# Patient Record
Sex: Male | Born: 1965 | Race: White | Hispanic: No
Health system: Southern US, Community
[De-identification: ages and names within clinical notes are randomized; demographics above are authoritative.]

---

## 2011-01-22 ENCOUNTER — Ambulatory Visit
Admission: RE | Admit: 2011-01-22 | Discharge: 2011-01-22 | Disposition: A | Discharge status: Home / Self Care | Payer: PRIVATE HEALTH INSURANCE | Source: Ambulatory Visit | Attending: Family Medicine | Admitting: Family Medicine

## 2011-01-22 ENCOUNTER — Other Ambulatory Visit: Payer: Self-pay | Admitting: Family Medicine

## 2011-01-22 DIAGNOSIS — M79669 Pain in unspecified lower leg: Secondary | ICD-10-CM

## 2013-05-02 ENCOUNTER — Ambulatory Visit
Admission: RE | Admit: 2013-05-02 | Discharge: 2013-05-02 | Disposition: A | Discharge status: Home / Self Care | Payer: BC Managed Care – PPO | Source: Ambulatory Visit | Attending: Family Medicine | Admitting: Family Medicine

## 2013-05-02 ENCOUNTER — Other Ambulatory Visit: Payer: Self-pay | Admitting: Family Medicine

## 2013-05-02 DIAGNOSIS — R079 Chest pain, unspecified: Secondary | ICD-10-CM

## 2016-10-19 ENCOUNTER — Other Ambulatory Visit (HOSPITAL_COMMUNITY): Payer: Self-pay | Admitting: Orthopedic Surgery

## 2016-10-19 DIAGNOSIS — M7989 Other specified soft tissue disorders: Principal | ICD-10-CM

## 2016-10-19 DIAGNOSIS — M79604 Pain in right leg: Secondary | ICD-10-CM

## 2016-10-20 ENCOUNTER — Ambulatory Visit (HOSPITAL_COMMUNITY)
Admission: RE | Admit: 2016-10-20 | Discharge: 2016-10-20 | Disposition: A | Discharge status: Home / Self Care | Payer: BLUE CROSS/BLUE SHIELD | Source: Ambulatory Visit | Attending: Orthopedic Surgery | Admitting: Orthopedic Surgery

## 2016-10-20 DIAGNOSIS — I824Z1 Acute embolism and thrombosis of unspecified deep veins of right distal lower extremity: Secondary | ICD-10-CM | POA: Insufficient documentation

## 2016-10-20 DIAGNOSIS — M79604 Pain in right leg: Secondary | ICD-10-CM | POA: Insufficient documentation

## 2016-10-20 DIAGNOSIS — M7989 Other specified soft tissue disorders: Secondary | ICD-10-CM | POA: Insufficient documentation

## 2016-10-20 NOTE — Progress Notes (Addendum)
**  Preliminary report by tech**  Right lower extremity venous duplex complete. There is no evidence of deep vein thrombosis involving the right lower extremity.  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 There is no evidence of a Baker's cyst on the right. [facility].   10/20/16 10:29 AM Olen CordialGreg Errin Chewning RVT

## 2020-01-05 ENCOUNTER — Emergency Department (HOSPITAL_COMMUNITY)
Admission: EM | Admit: 2020-01-05 | Discharge: 2020-01-05 | Disposition: A | Discharge status: Home / Self Care | Payer: Managed Care, Other (non HMO) | Attending: Emergency Medicine | Admitting: Emergency Medicine

## 2020-01-05 ENCOUNTER — Other Ambulatory Visit: Payer: Self-pay

## 2020-01-05 DIAGNOSIS — R6884 Jaw pain: Secondary | ICD-10-CM

## 2020-01-05 DIAGNOSIS — Z7984 Long term (current) use of oral hypoglycemic drugs: Secondary | ICD-10-CM | POA: Diagnosis not present

## 2020-01-05 DIAGNOSIS — H5713 Ocular pain, bilateral: Secondary | ICD-10-CM | POA: Diagnosis present

## 2020-01-05 DIAGNOSIS — Z79899 Other long term (current) drug therapy: Secondary | ICD-10-CM | POA: Diagnosis not present

## 2020-01-05 MED ORDER — KETOROLAC TROMETHAMINE 60 MG/2ML IM SOLN
60.0000 mg | Freq: Once | INTRAMUSCULAR | Status: AC
  Administered 2020-01-05: 60 mg via INTRAMUSCULAR
  Filled 2020-01-05: qty 2

## 2020-01-05 MED ORDER — TETRACAINE HCL 0.5 % OP SOLN
2.0000 [drp] | Freq: Once | OPHTHALMIC | Status: AC
  Administered 2020-01-05: 2 [drp] via OPHTHALMIC
  Filled 2020-01-05: qty 4

## 2020-01-05 NOTE — Discharge Instructions (Signed)
Thank you for letting us take care of you in the ER today  Please make sure to follow-up with your primary care doctor if your symptoms continue.  Return to the ER for any new or worsening symptoms.

## 2020-01-05 NOTE — ED Triage Notes (Signed)
Pt arrived from home complaining of irritation to both eyes and jaw pain. Upon examination, both eyes were noted to be red, swollen, irritated, watery, and sensitive to light. Pt drank solution for colonoscopy prior to the start of his symptoms.

## 2020-01-05 NOTE — ED Provider Notes (Signed)
Medical screening examination/treatment/procedure(s) were conducted as a shared visit with non-physician practitioner(s) and myself.  I personally evaluated the patient during the encounter.    54 year old male presents with bilateral eye burning and photophobia x1 day.  Denies any history of trauma.  Does not wear contact lens.  No fever or headache.  No neck discomfort.  On exam his bilateral injected conjunctive a.  Pupils are equal and reactive.  No active drainage noted.  Suspect possible chemical exposure.  Will irrigate eyes and reassess   Lorre Nick, MD 01/05/20 1034

## 2020-01-05 NOTE — ED Notes (Signed)
Visual acuity complete. R eye 20/20, L eye 20/25

## 2020-01-05 NOTE — ED Notes (Signed)
Pt last took PEG-3350- Sodium Chloride, Sodium Bicarb, and Potassium Chloride oral solution at home around 2230 and again around 0730.

## 2020-01-05 NOTE — ED Provider Notes (Signed)
[facility]-EMERGENCY DEPT Provider Note   CSN: [id number] Arrival date & time: 01/05/20  0946     History Chief Complaint  Patient presents with  . Eye Problem  . Allergic Reaction    Casey Mathis is a 54 y.o. male.  HPI 54 year old male with no significant medical history presents to the ER with complaints of irritation to both eyes, photosensitivity, and jaw pain.  Patient states that he was scheduled to have a colonoscopy today, and drink the colonoscopy sodium chloride, sodium bicarb and potassium chloride oral solution at around 1130 last night and again at 730 this morning.  He states that he woke up around 2 AM with severe burning to the eyes, pain, photosensitivity, and jaw pain.  He states that the jaw pain does not hurt with movement, but he states he feels like he cannot close his jaw all the way.  He denies any throat swelling, drooling, shortness of breath, chest pain, dizziness, syncope, radiation, rash, tongue swelling, lip swelling, eye swelling.  He does not wear any contacts, denies any eye injuries, no recent welding, no foreign liquids or objects that he is aware of.  He denies.  He denies any fevers or headaches.  He states he has some blurry ventilation secondary to pain.    No past medical history on file.  There are no problems to display for this patient.   The histories are not reviewed yet. Please review them in the "History" navigator section and refresh this SmartLink.     No family history on file.  Social History   Tobacco Use  . Smoking status: Not on file  Substance Use Topics  . Alcohol use: Not on file  . Drug use: Not on file    Home Medications Prior to Admission medications   Medication Sig Start Date End Date Taking? Authorizing Provider  acyclovir (ZOVIRAX) 400 MG tablet Take 400 mg by mouth daily. 12/24/19  Yes [provider]  escitalopram (LEXAPRO) 20 MG tablet Take 20 mg by mouth daily. 12/24/19  Yes  [provider]  metFORMIN (GLUCOPHAGE-XR) 500 MG 24 hr tablet Take 1,000 mg by mouth at bedtime. 11/04/19  Yes [provider]  pantoprazole (PROTONIX) 40 MG tablet Take 40 mg by mouth daily. 12/24/19  Yes [provider]  simvastatin (ZOCOR) 20 MG tablet Take 20 mg by mouth at bedtime. 12/24/19  Yes [provider]    Allergies    Sulfa antibiotics  Review of Systems   Review of Systems  Constitutional: Negative for chills and fever.  HENT: Negative for ear pain and sore throat.        Jaw pain   Eyes: Positive for photophobia, pain and visual disturbance.  Respiratory: Negative for cough and shortness of breath.   Cardiovascular: Negative for chest pain and palpitations.  Gastrointestinal: Negative for abdominal pain and vomiting.  Genitourinary: Negative for dysuria and hematuria.  Musculoskeletal: Negative for arthralgias and back pain.  Skin: Negative for color change and rash.  Neurological: Negative for seizures and syncope.  All other systems reviewed and are negative.   Physical Exam Updated Vital Signs BP 127/73   Pulse 70   Temp 97.7 F (36.5 C) (Axillary)   Resp (!) 22   SpO2 97%   Physical Exam Vitals and nursing note reviewed.  Constitutional:      Appearance: He is well-developed.  HENT:     Head: Normocephalic and atraumatic.     Mouth/Throat:  Comments: Full range of motion of jaw, no tongue swelling, uvula midline, tolerating secretions well, no tenderness to palpation to the jaw bilaterally. Eyes:     General: No scleral icterus.       Right eye: No discharge.        Left eye: No discharge.     Extraocular Movements: Extraocular movements intact.     Pupils: Pupils are equal, round, and reactive to light.     Comments: Bilateral injected conjunctive a.  Pupils equal and reactive, no evidence of hyphema, iris normal.  EOMs intact.  Vision grossly intact.  Eyes are watery, patient endorses photosensitivity when  opening his eyes.  Cardiovascular:     Rate and Rhythm: Normal rate and regular rhythm.     Heart sounds: No murmur heard.   Pulmonary:     Effort: Pulmonary effort is normal. No respiratory distress.     Breath sounds: Normal breath sounds.  Abdominal:     Palpations: Abdomen is soft.     Tenderness: There is no abdominal tenderness.  Musculoskeletal:        General: Deformity present. No tenderness. Normal range of motion.     Cervical back: Neck supple.  Skin:    General: Skin is warm and dry.  Neurological:     General: No focal deficit present.     Mental Status: He is alert and oriented to person, place, and time.     ED Results / Procedures / Treatments   Labs (all labs ordered are listed, but only abnormal results are displayed) Labs Reviewed - No data to display  EKG None  Radiology No results found.  Procedures Procedures (including critical care time)  Medications Ordered in ED Medications  tetracaine (PONTOCAINE) 0.5 % ophthalmic solution 2 drop (2 drops Both Eyes Given 01/05/20 1100)  ketorolac (TORADOL) injection 60 mg (60 mg Intramuscular Given 01/05/20 1209)    ED Course  I have reviewed the triage vital signs and the nursing notes.  Pertinent labs & imaging results that were available during my care of the patient were reviewed by me and considered in my medical decision making (see chart for details).    MDM Rules/Calculators/A&P                         3:3 AM: 54 year old male with complaints of high sensitivity, pain and jaw pain which began around 2 AM this morning.  Exam with bilateral injected conjunctive a with evidence of water, pupils are equal and reactive, vision is grossly intact.  No active crusting, drainage noted.  Suspicion for allergic reaction is low, patient does not have a rash, angioedema, swelling of the eyes or throat, itching.  Patient was also seen and evaluated by Dr. Freida Busman, suspect chemical exposure.  Plan for tetracaine,  irrigation of the eyes and reassessment.  Patient's eyes were irrigated with 500 cc of normal saline in each eye.  Patient was also given Toradol for jaw pain.  On reevaluation, patient reports improvement in eye sensitivity and vision. Low suspicion for ACS, dissection, no chest or neck pain. No evidence of anaphylactic reaction.   Visual acuity R eye 20/20 and L eye 20/25. Stable for discharge with followup for PCP. Return precautions discussed. He voices understanding and is agreeable.   Patient was seen and evaluated by Dr. Freida Busman who is agreeable to the above plan and disposition  Final Clinical Impression(s) / ED Diagnoses Final diagnoses:  Eye pain, bilateral  Jaw pain    Rx / DC Orders ED Discharge Orders    None       Leone Brand 01/05/20 1355    Lorre Nick, MD 01/08/20 1349

## 2021-03-12 ENCOUNTER — Other Ambulatory Visit: Payer: Self-pay | Admitting: Family Medicine

## 2021-03-12 DIAGNOSIS — R7401 Elevation of levels of liver transaminase levels: Secondary | ICD-10-CM

## 2021-03-13 ENCOUNTER — Ambulatory Visit
Admission: RE | Admit: 2021-03-13 | Discharge: 2021-03-13 | Disposition: A | Discharge status: Home / Self Care | Payer: Managed Care, Other (non HMO) | Source: Ambulatory Visit | Attending: Family Medicine | Admitting: Family Medicine

## 2021-03-13 ENCOUNTER — Other Ambulatory Visit: Payer: Self-pay

## 2021-03-13 DIAGNOSIS — R7401 Elevation of levels of liver transaminase levels: Secondary | ICD-10-CM

## 2021-04-23 ENCOUNTER — Other Ambulatory Visit: Payer: Self-pay | Admitting: Family Medicine

## 2021-04-23 ENCOUNTER — Ambulatory Visit
Admission: RE | Admit: 2021-04-23 | Discharge: 2021-04-23 | Disposition: A | Discharge status: Home / Self Care | Payer: Managed Care, Other (non HMO) | Source: Ambulatory Visit | Attending: Family Medicine | Admitting: Family Medicine

## 2021-04-23 ENCOUNTER — Other Ambulatory Visit: Payer: Self-pay

## 2021-04-23 DIAGNOSIS — R059 Cough, unspecified: Secondary | ICD-10-CM

## 2022-05-27 ENCOUNTER — Other Ambulatory Visit: Payer: Self-pay | Admitting: Sports Medicine

## 2022-05-27 DIAGNOSIS — M542 Cervicalgia: Secondary | ICD-10-CM

## 2022-06-13 ENCOUNTER — Ambulatory Visit
Admission: RE | Admit: 2022-06-13 | Discharge: 2022-06-13 | Disposition: A | Discharge status: Home / Self Care | Payer: 59 | Source: Ambulatory Visit | Attending: Sports Medicine | Admitting: Sports Medicine

## 2022-06-13 DIAGNOSIS — M542 Cervicalgia: Secondary | ICD-10-CM

## 2022-08-02 IMAGING — CR DG RIBS W/ CHEST 3+V*R*
5 series · 5 of 5 positions shown · non-contrast
Comparison: Chest radiograph dated 05/02/2013.

CLINICAL DATA: Cough and congestion.

EXAM:
RIGHT RIBS AND CHEST - 3+ VIEW

[w chest pa]
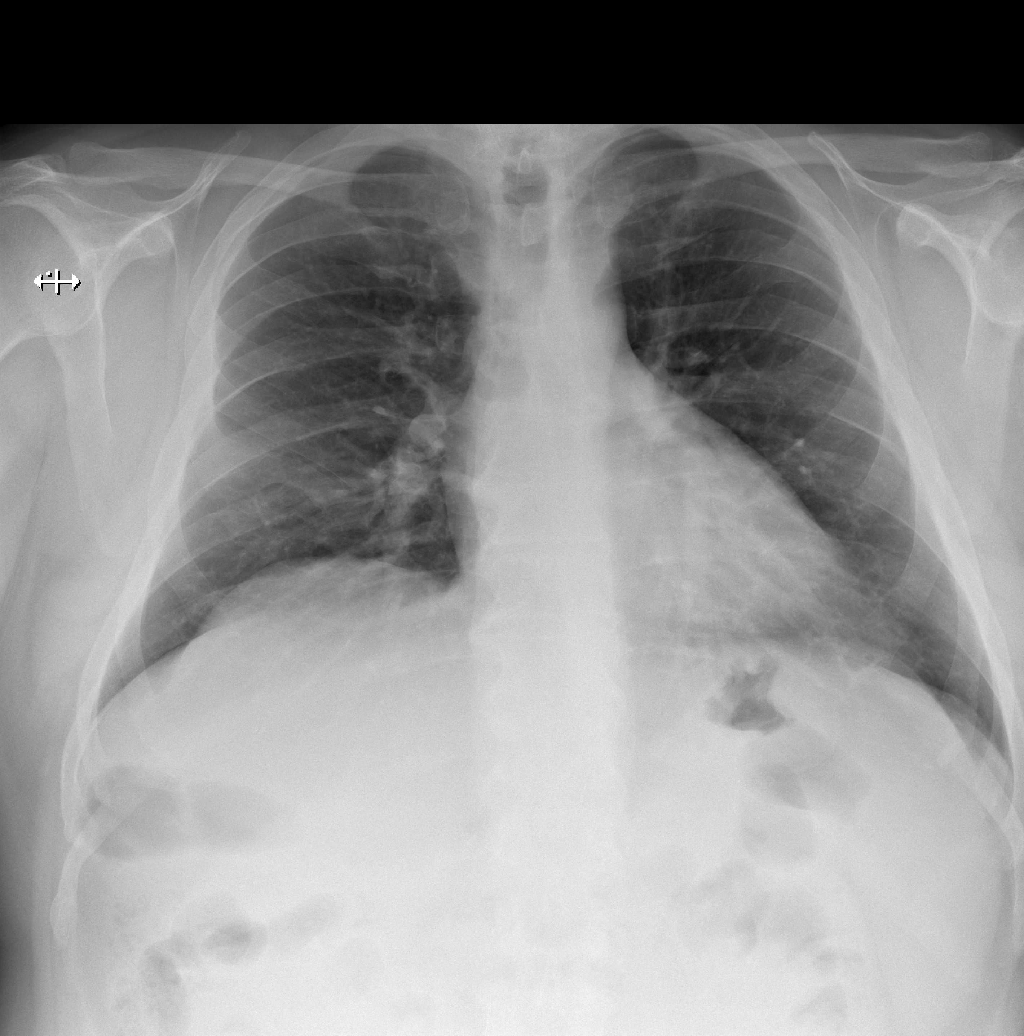

[w ribs ap upper right]
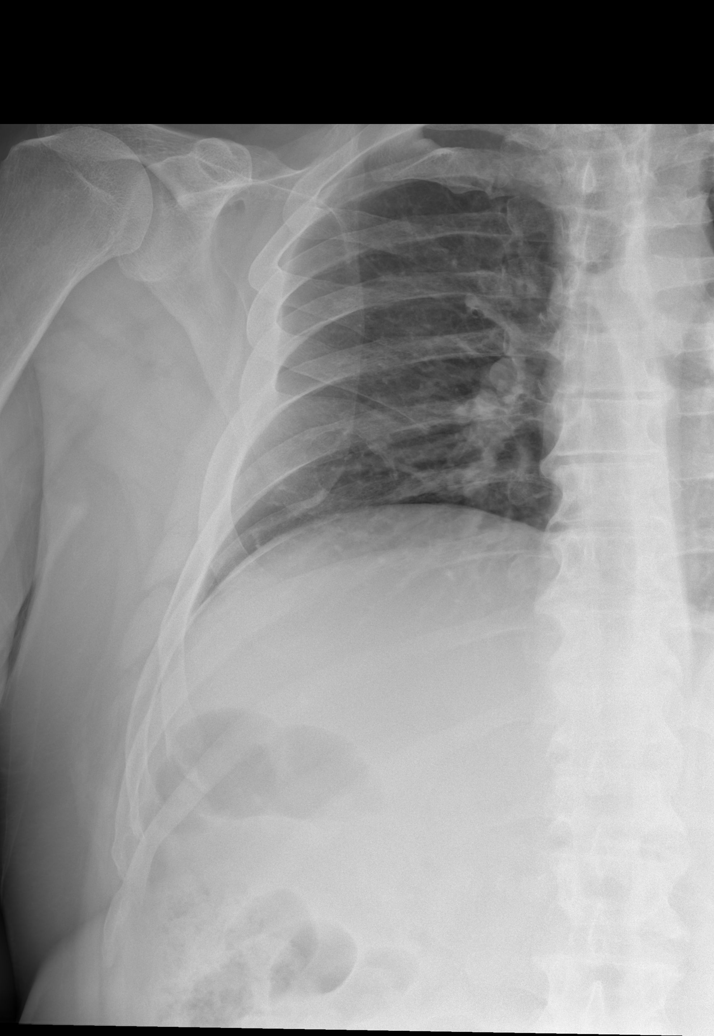

[w ribs ap lower right]
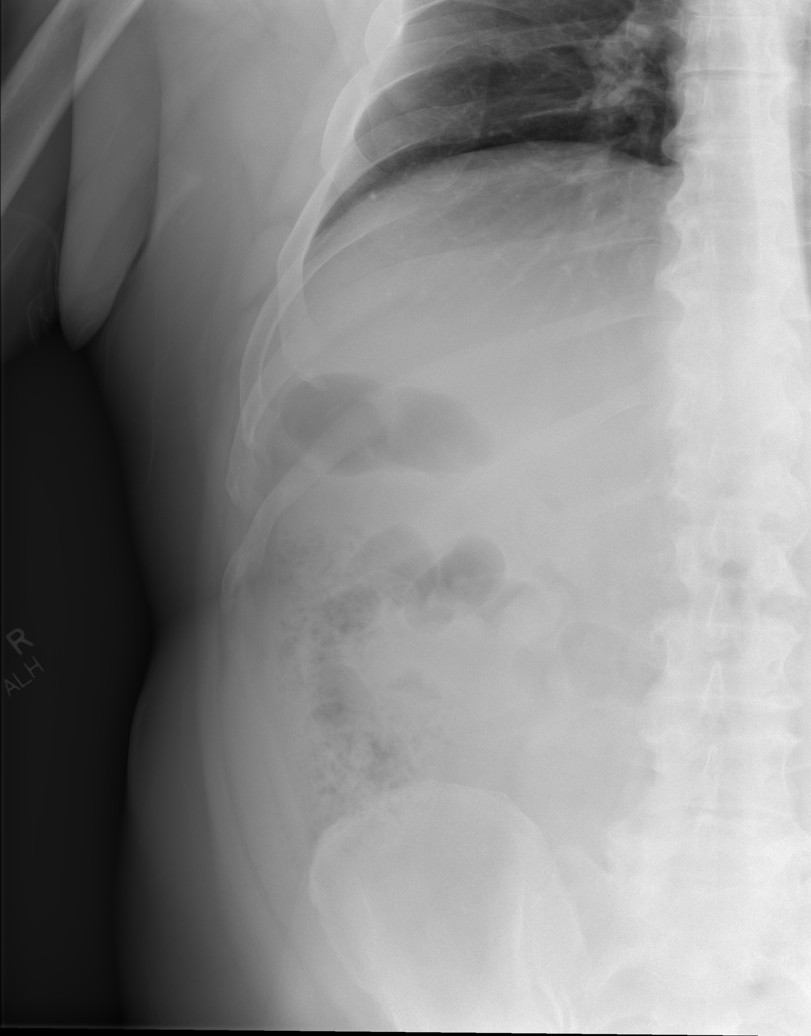

[w ribs obl right (1 of 2)]
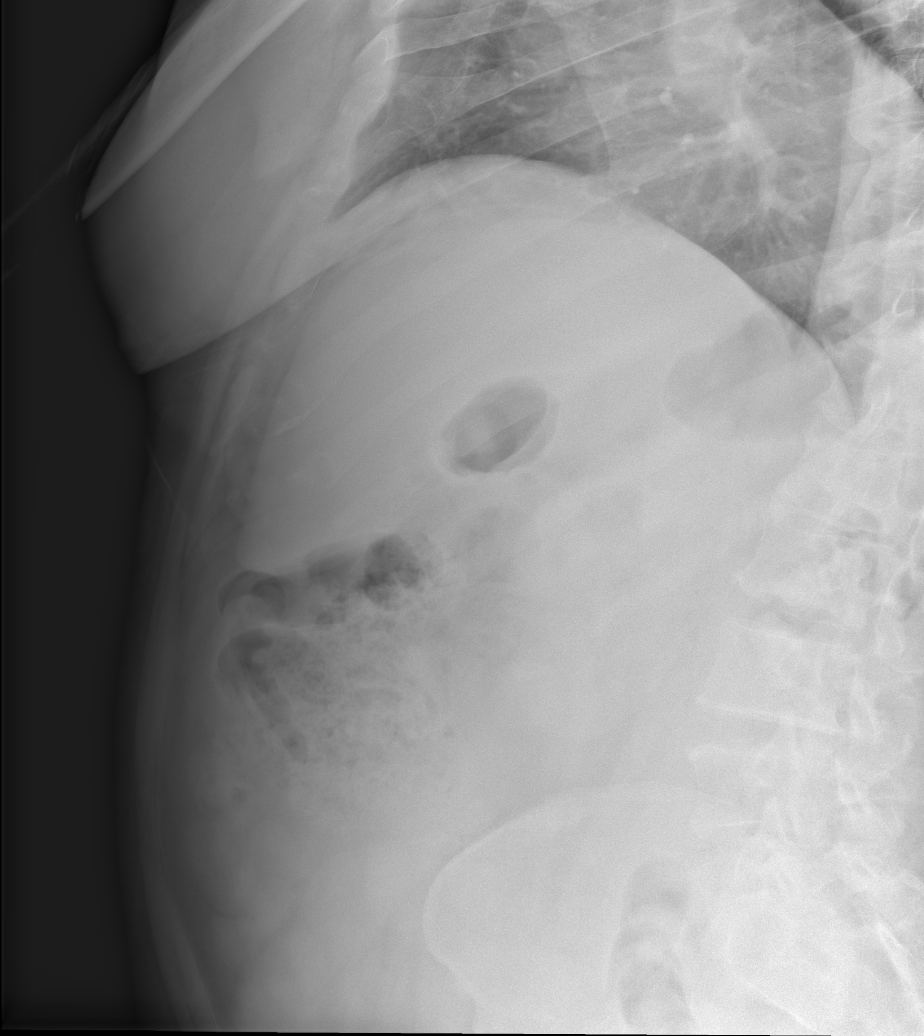

[w ribs obl right (2 of 2)]
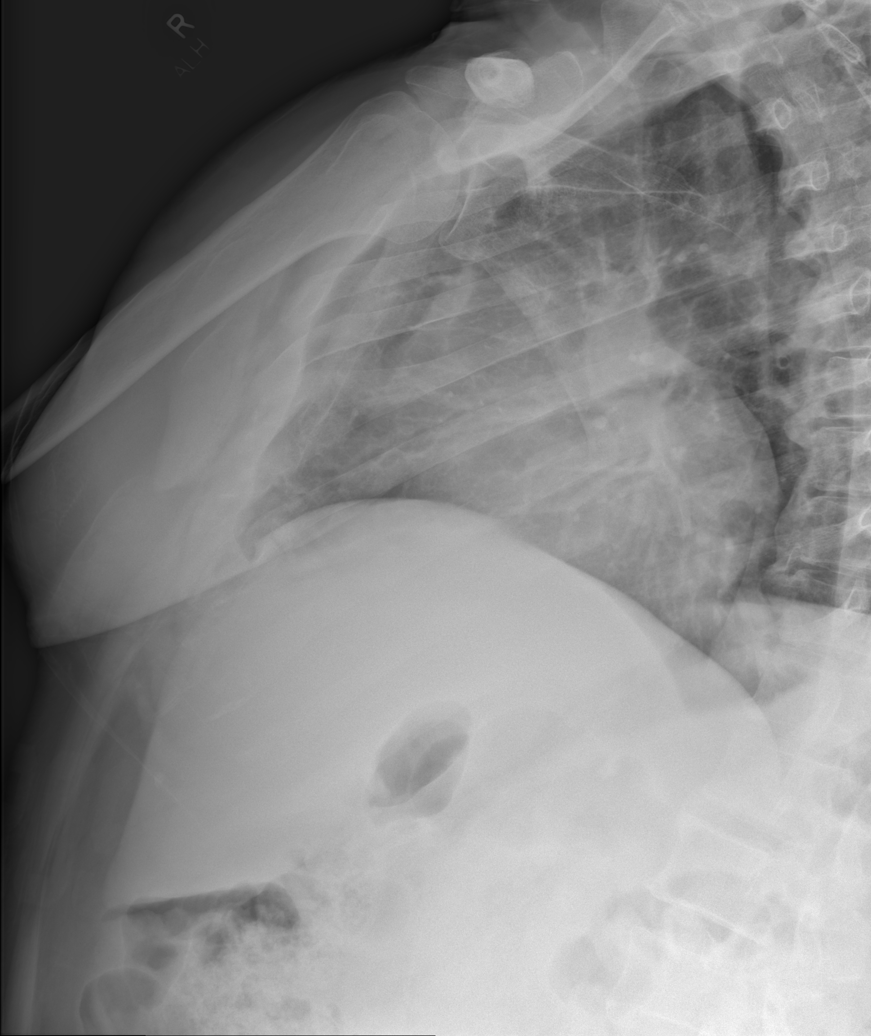

[5 of 5 positions shown; findings below may reference images not displayed]

FINDINGS: No focal consolidation, pleural effusion, or pneumothorax. The
cardiac silhouette is within normal limits. No acute osseous
pathology. No displaced rib fractures.
IMPRESSION: Negative.
